# Patient Record
Sex: Female | Born: 1935 | Race: White | Hispanic: No | Smoking: Former smoker
Health system: Southern US, Community
[De-identification: ages and names within clinical notes are randomized; demographics above are authoritative.]

## PROBLEM LIST (undated history)

## (undated) DIAGNOSIS — I739 Peripheral vascular disease, unspecified: Secondary | ICD-10-CM

## (undated) DIAGNOSIS — E0590 Thyrotoxicosis, unspecified without thyrotoxic crisis or storm: Secondary | ICD-10-CM

## (undated) DIAGNOSIS — I10 Essential (primary) hypertension: Secondary | ICD-10-CM

## (undated) DIAGNOSIS — I639 Cerebral infarction, unspecified: Secondary | ICD-10-CM

## (undated) DIAGNOSIS — G4700 Insomnia, unspecified: Secondary | ICD-10-CM

## (undated) HISTORY — DX: Essential (primary) hypertension: I10

## (undated) HISTORY — PX: CHOLECYSTECTOMY: SHX55

## (undated) HISTORY — PX: EYE SURGERY: SHX253

## (undated) HISTORY — DX: Insomnia, unspecified: G47.00

## (undated) HISTORY — DX: Cerebral infarction, unspecified: I63.9

## (undated) HISTORY — DX: Peripheral vascular disease, unspecified: I73.9

## (undated) HISTORY — DX: Thyrotoxicosis, unspecified without thyrotoxic crisis or storm: E05.90

## (undated) HISTORY — PX: BLADDER SURGERY: SHX569

---

## 2005-08-31 ENCOUNTER — Encounter: Admission: RE | Admit: 2005-08-31 | Discharge: 2005-08-31 | Payer: Self-pay | Admitting: Neurosurgery

## 2005-09-15 ENCOUNTER — Inpatient Hospital Stay (HOSPITAL_COMMUNITY): Admission: RE | Admit: 2005-09-15 | Discharge: 2005-09-16 | Payer: Self-pay | Admitting: Neurosurgery

## 2006-11-12 IMAGING — CR DG LUMBAR SPINE 1V
1 series · 1 of 1 positions shown · non-contrast
Comparison: None.

CLINICAL DATA: Right L3-4 laminotomy.
 PORTABLE LATERAL LUMBAR SPINE ? 1 VIEW:

[view not recorded]
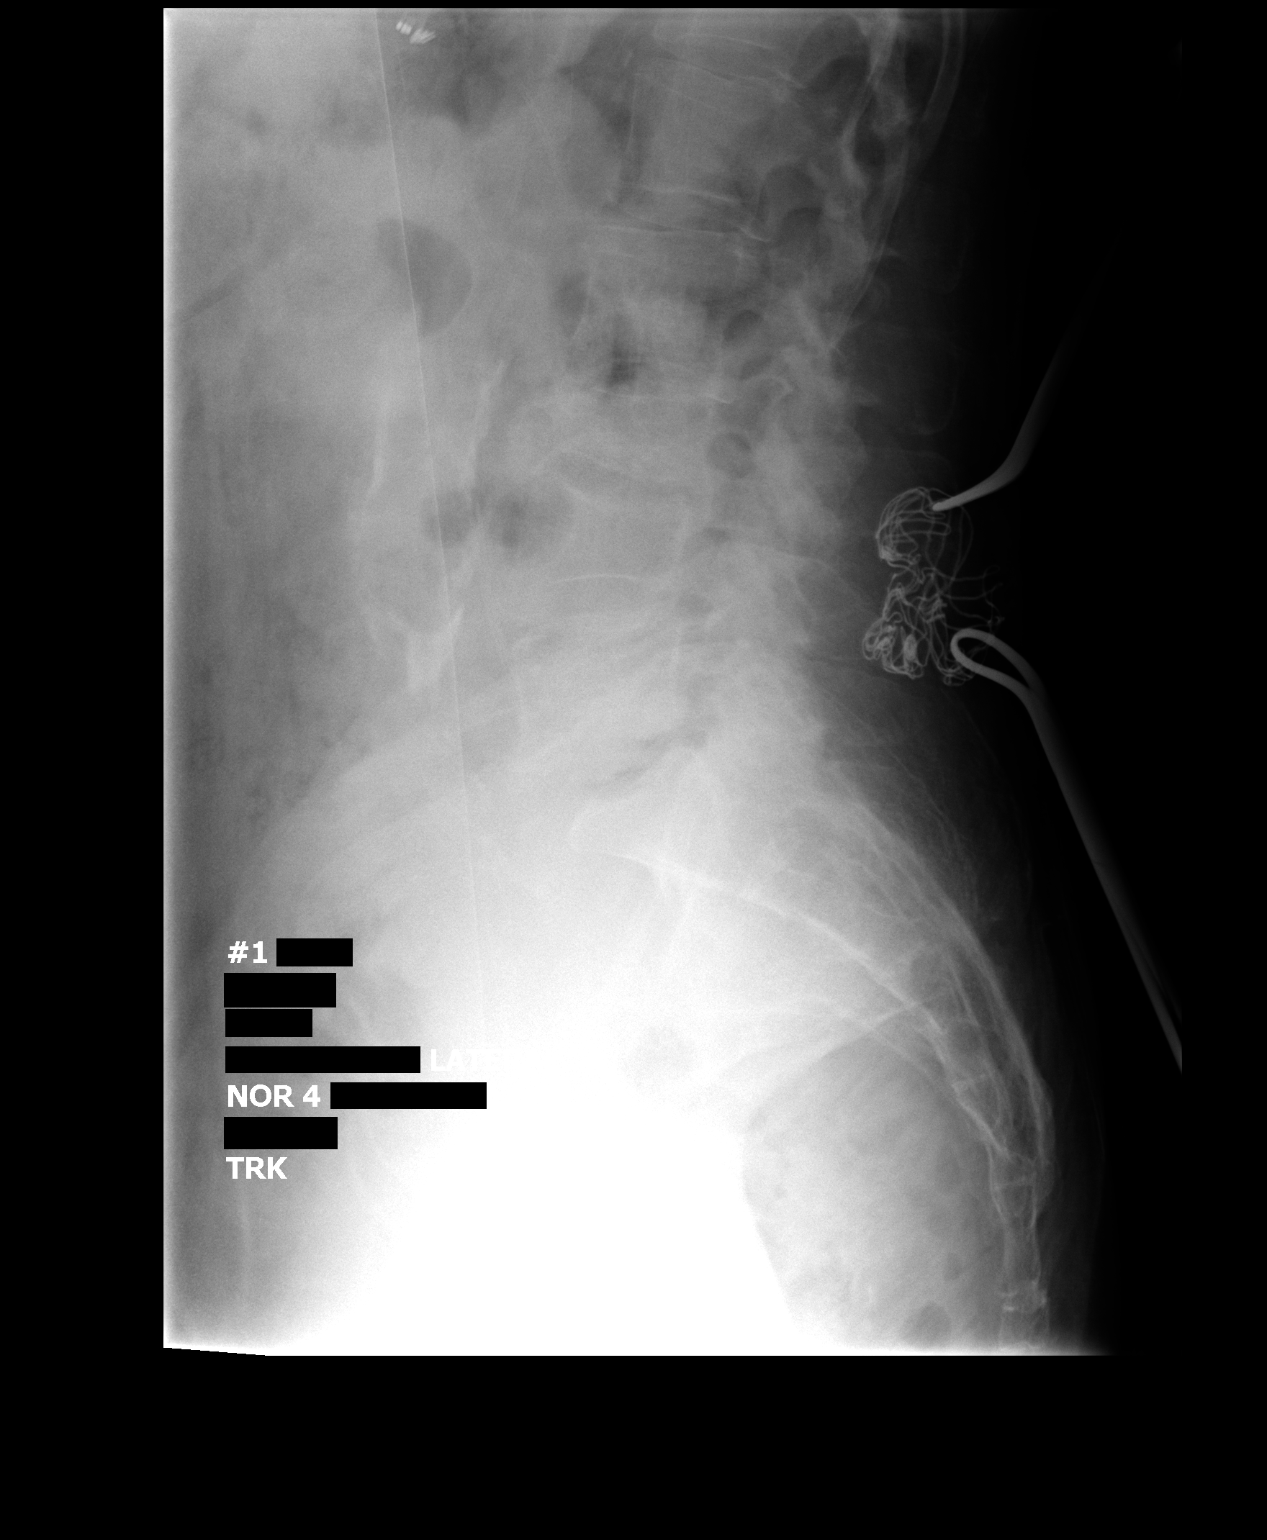

[1 of 1 positions shown; findings below may reference images not displayed]

FINDINGS: The patient is scoliotic.  There are instruments on the spinous processes of L3 and L4.
IMPRESSION: Lumbar localization as above.

## 2013-06-05 ENCOUNTER — Other Ambulatory Visit: Payer: Self-pay | Admitting: *Deleted

## 2013-06-05 ENCOUNTER — Encounter: Payer: Self-pay | Admitting: Vascular Surgery

## 2013-06-05 DIAGNOSIS — I70229 Atherosclerosis of native arteries of extremities with rest pain, unspecified extremity: Secondary | ICD-10-CM

## 2013-06-06 ENCOUNTER — Encounter: Payer: Self-pay | Admitting: Vascular Surgery

## 2013-06-06 ENCOUNTER — Ambulatory Visit (HOSPITAL_COMMUNITY)
Admission: RE | Admit: 2013-06-06 | Discharge: 2013-06-06 | Disposition: A | Discharge status: Home / Self Care | Payer: Medicare Other | Source: Ambulatory Visit | Attending: Vascular Surgery | Admitting: Vascular Surgery

## 2013-06-06 ENCOUNTER — Ambulatory Visit (INDEPENDENT_AMBULATORY_CARE_PROVIDER_SITE_OTHER): Payer: Medicare Other | Admitting: Vascular Surgery

## 2013-06-06 VITALS — BP 174/51 | HR 50 | Ht 62.0 in | Wt 125.0 lb

## 2013-06-06 DIAGNOSIS — I70229 Atherosclerosis of native arteries of extremities with rest pain, unspecified extremity: Secondary | ICD-10-CM | POA: Insufficient documentation

## 2013-06-06 DIAGNOSIS — L98499 Non-pressure chronic ulcer of skin of other sites with unspecified severity: Secondary | ICD-10-CM

## 2013-06-06 DIAGNOSIS — I7025 Atherosclerosis of native arteries of other extremities with ulceration: Secondary | ICD-10-CM | POA: Insufficient documentation

## 2013-06-06 DIAGNOSIS — I739 Peripheral vascular disease, unspecified: Secondary | ICD-10-CM

## 2013-06-06 NOTE — Progress Notes (Signed)
Patient name: Kathleen Wilkins MRN: [id number] DOB: 26-Jan-1924 Sex: female   Referred by: Sherryll BurgerShah  Reason for referral:  Chief Complaint  Patient presents with  . New Evaluation    ischemic rest pain     HISTORY OF PRESENT ILLNESS: Patient is a very pleasant 80 year old female who presents today for evaluation of lower extremity arterial insufficiency. 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 She had presented to her medical doctor with erythema over the dorsum of her left foot and some ulcerations over her first metatarsal head and also her lateral fifth metatarsal. She reported pain associated with this. She has had a dramatic improvement of this with complete resolution of the erythema over the dorsum of her foot and to healing of the area of her first metatarsal head and near healing of her for fifth metatarsal head. She reports the pain is much better as well. She does walk independently.  Past Medical History  Diagnosis Date  . Peripheral vascular disease   . Hypertension   . Hyperthyroidism   . Insomnia   . Stroke     Past Surgical History  Procedure Laterality Date  . Cholecystectomy    . Eye surgery      cataract surgery  . Bladder surgery      History   Social History  . Marital Status: Widowed    Spouse Name: N/A    Number of Children: N/A  . Years of Education: N/A   Occupational History  . Not on file.   Social History Main Topics  . Smoking status: Former Smoker    Quit date: 06/06/1953  . Smokeless tobacco: Not on file  . Alcohol Use: No  . Drug Use: No  . Sexual Activity: Not on file   Other Topics Concern  . Not on file   Social History Narrative  . No narrative on file    Family History  Problem Relation Age of Onset  . Cancer Other   . Peripheral vascular disease Other   . Stroke Other   . Heart disease Mother   . Heart attack Mother   . Heart attack Father   . Cancer Sister     . Hyperlipidemia Brother   . Hypertension Brother   . Heart attack Brother   . Hyperlipidemia Son   . Hypertension Brother     Allergies as of 06/06/2013  . (No Known Allergies)    No current outpatient prescriptions on file prior to visit.   No current facility-administered medications on file prior to visit.     REVIEW OF SYSTEMS:  Positives indicated with an "X"  CARDIOVASCULAR:  [ ]  chest pain   [ ]  chest pressure   [ ]  palpitations   [ ]  orthopnea   [ ]  dyspnea on exertion   [ ]  claudication   [x ] rest pain   [ ]  DVT   [ ]  phlebitis PULMONARY:   [ ]  productive cough   [ ]  asthma   [ ]  wheezing NEUROLOGIC:   [x ] weakness  [ ]  paresthesias  [ ]  aphasia  [ ]  amaurosis  [x ] dizziness HEMATOLOGIC:   [ ]  bleeding problems   [ ]  clotting disorders MUSCULOSKELETAL:  [ ]  joint pain   [ ]  joint swelling GASTROINTESTINAL: [ ]   blood in stool  [ ]   hematemesis GENITOURINARY:  [ ]   dysuria  [ ]   hematuria PSYCHIATRIC:  [ ]  history of major depression INTEGUMENTARY:  [ ]  rashes  [x ] ulcers CONSTITUTIONAL:  [ ]  fever   [ ]  chills  PHYSICAL EXAMINATION:  General: The patient is a well-nourished female, in no acute distress. Vital signs are BP 174/51  Pulse 50  Ht 5\' 2"  (1.575 m)  Wt 125 lb (56.7 kg)  BMI 22.86 kg/m2  SpO2 99% Pulmonary: There is a good air exchange bilaterally without wheezing or rales. Abdomen: Soft and non-tender with normal pitch bowel sounds. Musculoskeletal: There are no major deformities.  There is no significant extremity pain. Neurologic: No focal weakness or paresthesias are detected, Skin: Mild cyanosis left versus right foot. Small 2 mm ulcer over the lateral aspect of her left fifth metatarsal head Psychiatric: The patient has normal affect. Cardiovascular: There is a regular rate and rhythm without significant murmur appreciated. Carotid arteries without bruits bilaterally [facility] Vascular Lab Studies:  Ordered and Independently Reviewed lower  surety arterial studies reveal biphasic flow in the right posterior tibial with an ankle arm index of 0.73 on the left she has monophasic flow with an ankle arm index of 0.54  Impression and Plan:  Lower extremity arterial insufficiency with improvement in her left foot. She's had complete resolution of erythema that was present and near complete healing of her ulceration. Had a long discussion with the patient and her family present. I explained that she does have moderate to severe arterial insufficiency on the left and that she may require further intervention or treatment. Since this is improving with appropriate conservative treatment would recommend continued conservative treatment currently. If she has progressive pain or tissue loss she will require arteriography for further evaluation. She has 2+ femoral pulses is a therefore does have an infrainguinal disease. We will see her on an as-needed basis    Kristen Loaderodd F [facility] [place] Office: [phone]

## 2017-09-09 DEATH — deceased
